# Patient Record
Sex: Female | Born: 1980 | Race: White | Hispanic: No | Marital: Single | Smoking: Never smoker
Health system: Southern US, Community
[De-identification: ages and names within clinical notes are randomized; demographics above are authoritative.]

## PROBLEM LIST (undated history)

## (undated) HISTORY — PX: AUGMENTATION MAMMAPLASTY: SUR837

## (undated) HISTORY — PX: NO PAST SURGERIES: SHX2092

---

## 2018-04-11 ENCOUNTER — Ambulatory Visit
Admission: EM | Admit: 2018-04-11 | Discharge: 2018-04-11 | Disposition: A | Discharge status: Home / Self Care | Payer: BLUE CROSS/BLUE SHIELD | Attending: Family Medicine | Admitting: Family Medicine

## 2018-04-11 ENCOUNTER — Encounter: Payer: Self-pay | Admitting: Emergency Medicine

## 2018-04-11 ENCOUNTER — Ambulatory Visit (INDEPENDENT_AMBULATORY_CARE_PROVIDER_SITE_OTHER): Payer: BLUE CROSS/BLUE SHIELD

## 2018-04-11 ENCOUNTER — Other Ambulatory Visit: Payer: Self-pay

## 2018-04-11 DIAGNOSIS — S0992XA Unspecified injury of nose, initial encounter: Secondary | ICD-10-CM

## 2018-04-11 DIAGNOSIS — W1830XA Fall on same level, unspecified, initial encounter: Secondary | ICD-10-CM | POA: Diagnosis not present

## 2018-04-11 MED ORDER — MELOXICAM 15 MG PO TABS: 15.0000 mg | ORAL_TABLET | Freq: Every day | ORAL | 0 refills | Status: DC | PRN

## 2018-04-11 NOTE — ED Provider Notes (Signed)
[facility]    CSN: [id number] Arrival date & time: 04/11/18  1751  History   Chief Complaint Chief Complaint  Patient presents with  . Fall   HPI  37 year old female presents with the above complaint.  Patient [place] 2 [place].  Patient states that she tripped on the sidewalk and fell.  When she fell she protected her mouth but injured her nose.  Patient also suffered some abrasions around her left eye.  She states that she has been doing okay but her nose is persistently swollen and appears abnormal.  She has a bump on the bridge of her nose.  States that swelling in the look of her nose has not improved despite this occurring 2 weeks ago.  She is concerned about nasal fracture.  She is really concerned about her appearance.  She is breathing without difficulty.  She reports mild pain.  No other reported symptoms.  No other complaints.  PMH, Surgical Hx, Family Hx, Social History reviewed and updated as below.  No significant PMH.  Past Surgical History:  Procedure Laterality Date  . NO PAST SURGERIES     OB History   No obstetric history on file.    Home Medications    Prior to Admission medications   Medication Sig Start Date End Date Taking? Authorizing Provider  desogestrel-ethinyl estradiol (MIRCETTE) 0.15-0.02/0.01 MG (21/5) tablet Take 1 tablet by mouth daily.   Yes [provider]  meloxicam (MOBIC) 15 MG tablet Take 1 tablet (15 mg total) by mouth daily as needed. 04/11/18   Tommie Samsook, Dorothy Polhemus G, DO   Family History Family History  Problem Relation Age of Onset  . Healthy Mother   . Cancer Father     Social History Social History   Tobacco Use  . Smoking status: Never Smoker  . Smokeless tobacco: Never Used  Substance Use Topics  . Alcohol use: Never    Frequency: Never  . Drug use: Never     Allergies   Patient has no known allergies.   Review of Systems Review of Systems    Constitutional: Negative.   HENT:       Facial injury, nasal injury.   Physical Exam Triage Vital Signs ED Triage Vitals [04/11/18 1805]  Enc Vitals Group     BP      Pulse      Resp      Temp      Temp src      SpO2      Weight 126 lb (57.2 kg)     Height 5\' 9"  (1.753 m)     Head Circumference      Peak Flow      Pain Score 2     Pain Loc      Pain Edu?      Excl. in GC?    Updated Vital Signs Ht 5\' 9"  (1.753 m)   Wt 57.2 kg   LMP 03/21/2018 (Approximate)   BMI 18.61 kg/m   Visual Acuity Right Eye Distance:   Left Eye Distance:   Bilateral Distance:    Right Eye Near:   Left Eye Near:    Bilateral Near:     Physical Exam Vitals signs and nursing note reviewed.  Constitutional:      General: She is not in acute distress. HENT:     Nose:     Comments: Mild swelling noted of the nasal bridge.  There is a  visible raised area at the nasal bridge.  No palpable crepitus.  Mildly tender to palpation. Eyes:     General: No scleral icterus.    Conjunctiva/sclera: Conjunctivae normal.     Pupils: Pupils are equal, round, and reactive to light.  Cardiovascular:     Rate and Rhythm: Normal rate and regular rhythm.  Pulmonary:     Effort: Pulmonary effort is normal. No respiratory distress.  Neurological:     Mental Status: She is alert.  Psychiatric:        Mood and Affect: Mood normal.        Behavior: Behavior normal.    UC Treatments / Results  Labs (all labs ordered are listed, but only abnormal results are displayed) Labs Reviewed - No data to display  EKG None  Radiology Dg Nasal Bones  Result Date: 04/11/2018 CLINICAL DATA:  Fall, nose injury 2 weeks ago EXAM: NASAL BONES - 3+ VIEW COMPARISON:  None. FINDINGS: There is no evidence of fracture or other bone abnormality. IMPRESSION: Negative. Electronically Signed   By: Marlan Palauharles  Clark M.D.   On: 04/11/2018 18:45    Procedures Procedures (including critical care time)  Medications Ordered in  UC Medications - No data to display  Initial Impression / Assessment and Plan / UC Course  I have reviewed the triage vital signs and the nursing notes.  Pertinent labs & imaging results that were available during my care of the patient were reviewed by me and considered in my medical decision making (see chart for details).    37 year old female presents with a fall/nasal injury.  X-ray was obtained and was negative.  Advised her to see a plastic surgeon if she continues to be concerned about the cosmetic appearance of her nose. Mobic as needed.  Final Clinical Impressions(s) / UC Diagnoses   Final diagnoses:  Injury of nose, initial encounter     Discharge Instructions     Medication as needed.  Contact plastic surgery - [facility].  Take care  Dr. Adriana Simasook     ED Prescriptions    Medication Sig Dispense Auth. Provider   meloxicam (MOBIC) 15 MG tablet Take 1 tablet (15 mg total) by mouth daily as needed. 30 tablet Tommie Samsook, Chevie Birkhead G, DO     Controlled Substance Prescriptions [place] Controlled Substance Registry consulted? Not Applicable   Tommie SamsCook, Fabrizio Filip G, DO 04/11/18 1901

## 2018-04-11 NOTE — Discharge Instructions (Signed)
Medication as needed.  Contact plastic surgery - [facility].  Take care  Dr. Adriana Simasook

## 2018-04-11 NOTE — ED Triage Notes (Signed)
Pt fell at the parade 2 weeks ago and is still having pain, bruising, and swelling of her nose, and orbital bones. She is concerned that her nose is broken.

## 2019-04-05 ENCOUNTER — Other Ambulatory Visit: Payer: Self-pay | Admitting: General Practice

## 2019-04-05 DIAGNOSIS — Z20822 Contact with and (suspected) exposure to covid-19: Secondary | ICD-10-CM

## 2019-04-06 ENCOUNTER — Telehealth: Payer: Self-pay | Admitting: Critical Care Medicine

## 2019-04-06 LAB — NOVEL CORONAVIRUS, NAA: SARS-CoV-2, NAA: DETECTED — AB

## 2019-04-06 NOTE — Telephone Encounter (Signed)
I connected this patient who is Covid positive from December 12.  She has been symptomatic since December 8th.  She knows to be in isolation at least until at least December 19.  She is not a candidate for monoclonal antibody.  Her symptoms are mild.

## 2019-04-06 NOTE — Telephone Encounter (Signed)
-----   Message from Sarah E Ellington, RN sent at 04/06/2019  4:17 PM EST -----  ----- Message ----- From: Interface, [facility] Lab Results In Sent: 04/06/2019   2:36 PM EST To: Mobile Screening Testing Result Pool   

## 2021-04-21 ENCOUNTER — Other Ambulatory Visit: Payer: Self-pay | Admitting: Otolaryngology

## 2021-04-21 DIAGNOSIS — S0292XS Unspecified fracture of facial bones, sequela: Secondary | ICD-10-CM

## 2021-04-21 DIAGNOSIS — M950 Acquired deformity of nose: Secondary | ICD-10-CM

## 2021-05-03 ENCOUNTER — Ambulatory Visit
Admission: RE | Admit: 2021-05-03 | Discharge: 2021-05-03 | Disposition: A | Discharge status: Home / Self Care | Payer: BC Managed Care – PPO | Source: Ambulatory Visit | Attending: Otolaryngology | Admitting: Otolaryngology

## 2021-05-03 ENCOUNTER — Other Ambulatory Visit: Payer: Self-pay

## 2021-05-03 DIAGNOSIS — X58XXXS Exposure to other specified factors, sequela: Secondary | ICD-10-CM | POA: Diagnosis not present

## 2021-05-03 DIAGNOSIS — J3489 Other specified disorders of nose and nasal sinuses: Secondary | ICD-10-CM | POA: Insufficient documentation

## 2021-05-03 DIAGNOSIS — S0292XS Unspecified fracture of facial bones, sequela: Secondary | ICD-10-CM | POA: Insufficient documentation

## 2021-05-03 DIAGNOSIS — R0600 Dyspnea, unspecified: Secondary | ICD-10-CM | POA: Diagnosis not present

## 2021-05-03 DIAGNOSIS — R0683 Snoring: Secondary | ICD-10-CM | POA: Insufficient documentation

## 2021-05-03 DIAGNOSIS — M950 Acquired deformity of nose: Secondary | ICD-10-CM | POA: Insufficient documentation

## 2021-05-03 DIAGNOSIS — S0291XS Unspecified fracture of skull, sequela: Secondary | ICD-10-CM | POA: Diagnosis present

## 2021-05-25 HISTORY — PX: NASAL SEPTUM SURGERY: SHX37

## 2021-08-26 ENCOUNTER — Encounter: Payer: BLUE CROSS/BLUE SHIELD | Admitting: Obstetrics and Gynecology

## 2021-09-08 ENCOUNTER — Ambulatory Visit (INDEPENDENT_AMBULATORY_CARE_PROVIDER_SITE_OTHER): Payer: BC Managed Care – PPO | Admitting: Obstetrics and Gynecology

## 2021-09-08 ENCOUNTER — Encounter: Payer: Self-pay | Admitting: Obstetrics and Gynecology

## 2021-09-08 ENCOUNTER — Other Ambulatory Visit (HOSPITAL_COMMUNITY)
Admission: RE | Admit: 2021-09-08 | Discharge: 2021-09-08 | Disposition: A | Discharge status: Home / Self Care | Payer: BC Managed Care – PPO | Source: Ambulatory Visit | Attending: Obstetrics and Gynecology | Admitting: Obstetrics and Gynecology

## 2021-09-08 VITALS — BP 130/71 | HR 94 | Ht 69.0 in | Wt 124.5 lb

## 2021-09-08 DIAGNOSIS — Z01419 Encounter for gynecological examination (general) (routine) without abnormal findings: Secondary | ICD-10-CM | POA: Insufficient documentation

## 2021-09-08 DIAGNOSIS — Z124 Encounter for screening for malignant neoplasm of cervix: Secondary | ICD-10-CM | POA: Diagnosis not present

## 2021-09-08 DIAGNOSIS — Z7689 Persons encountering health services in other specified circumstances: Secondary | ICD-10-CM

## 2021-09-08 MED ORDER — DESOGESTREL-ETHINYL ESTRADIOL 0.15-0.02/0.01 MG (21/5) PO TABS: 1.0000 | ORAL_TABLET | Freq: Every day | ORAL | 3 refills | Status: DC

## 2021-09-08 NOTE — Progress Notes (Signed)
HPI:      [name] is a 41 y.o. G0P0000 who LMP was Patient's last menstrual period was 08/30/2021 (within days).  Subjective:   She presents today for her annual examination.  She is having normal regular cycles on OCPs.  She likes Mircette and would like to stay on pills. She reports that she had an abnormal Pap smear 6 years ago and underwent LEEP.  She says her Paps have been normal since then but she is now due for her Pap.    Hx: The following portions of the patient's history were reviewed and updated as appropriate:             She  has no past medical history on file. She does not have a problem list on file. She  has a past surgical history that includes No past surgeries and Nasal septum surgery (05/25/2021). Her family history includes Cancer in her father; Healthy in her mother. She  reports that she has never smoked. She has never used smokeless tobacco. She reports that she does not drink alcohol and does not use drugs. She has a current medication list which includes the following prescription(s): desogestrel-ethinyl estradiol. She has no allergies on file.       Review of Systems:  Review of Systems  Constitutional: Denied constitutional symptoms, night sweats, recent illness, fatigue, fever, insomnia and weight loss.  Eyes: Denied eye symptoms, eye pain, photophobia, vision change and visual disturbance.  Ears/Nose/Throat/Neck: Denied ear, nose, throat or neck symptoms, hearing loss, nasal discharge, sinus congestion and sore throat.  Cardiovascular: Denied cardiovascular symptoms, arrhythmia, chest pain/pressure, edema, exercise intolerance, orthopnea and palpitations.  Respiratory: Denied pulmonary symptoms, asthma, pleuritic pain, productive sputum, cough, dyspnea and wheezing.  Gastrointestinal: Denied, gastro-esophageal reflux, melena, nausea and vomiting.  Genitourinary: Denied genitourinary symptoms including symptomatic vaginal discharge, pelvic  relaxation issues, and urinary complaints.  Musculoskeletal: Denied musculoskeletal symptoms, stiffness, swelling, muscle weakness and myalgia.  Dermatologic: Denied dermatology symptoms, rash and scar.  Neurologic: Denied neurology symptoms, dizziness, headache, neck pain and syncope.  Psychiatric: Denied psychiatric symptoms, anxiety and depression.  Endocrine: Denied endocrine symptoms including hot flashes and night sweats.   Meds:   No current outpatient medications on file prior to visit.   No current facility-administered medications on file prior to visit.     Objective:     Vitals:   09/08/21 1332  BP: 130/71  Pulse: 94    Filed Weights   09/08/21 1332  Weight: 124 lb 8 oz (56.5 kg)              Physical examination General NAD, Conversant  HEENT Atraumatic; Op clear with mmm.  Normo-cephalic. Pupils reactive. Anicteric sclerae  Thyroid/Neck Smooth without nodularity or enlargement. Normal ROM.  Neck Supple.  Skin No rashes, lesions or ulceration. Normal palpated skin turgor. No nodularity.  Breasts: No masses or discharge.  Symmetric.  No axillary adenopathy.  Bilateral implants  Lungs: Clear to auscultation.No rales or wheezes. Normal Respiratory effort, no retractions.  Heart: NSR.  No murmurs or rubs appreciated. No periferal edema  Abdomen: Soft.  Non-tender.  No masses.  No HSM. No hernia  Extremities: Moves all appropriately.  Normal ROM for age. No lymphadenopathy.  Neuro: Oriented to PPT.  Normal mood. Normal affect.     Pelvic:   Vulva: Normal appearance.  No lesions.  Vagina: No lesions or abnormalities noted.  Support: Normal pelvic support.  Urethra No masses tenderness or scarring.  Meatus  Normal size without lesions or prolapse.  Cervix: Normal appearance.  No lesions.  Friable  Anus: Normal exam.  No lesions.  Perineum: Normal exam.  No lesions.        Bimanual   Uterus: Normal size.  Non-tender.  Mobile.  AV.  Adnexae: No masses.   Non-tender to palpation.  Cul-de-sac: Negative for abnormality.     Assessment:    G0P0000 There are no problems to display for this patient.    1. Well woman exam with routine gynecological exam   2. Cervical cancer screening   3. Establishing care with new doctor, encounter for        Plan:            1.  Basic Screening Recommendations The basic screening recommendations for asymptomatic women were discussed with the patient during her visit.  The age-appropriate recommendations were discussed with her and the rational for the tests reviewed.  When I am informed by the patient that another primary care physician has previously obtained the age-appropriate tests and they are up-to-date, only outstanding tests are ordered and referrals given as necessary.  Abnormal results of tests will be discussed with her when all of her results are completed.  Routine preventative health maintenance measures emphasized: Exercise/Diet/Weight control, Tobacco Warnings, Alcohol/Substance use risks and Stress Management Pap performed -mammogram next year -patient gets blood work through her PCP. Orders No orders of the defined types were placed in this encounter.    Meds ordered this encounter  Medications   desogestrel-ethinyl estradiol (MIRCETTE) 0.15-0.02/0.01 MG (21/5) tablet    Sig: Take 1 tablet by mouth daily.    Dispense:  84 tablet    Refill:  3          F/U  No follow-ups on file.  Elonda Husky, M.D. 09/08/2021 2:41 PM

## 2021-09-08 NOTE — Progress Notes (Signed)
Patients presents for annual exam today. Patient states no other questions or concerns at this time. Patient is due for pap smear, ordered.

## 2021-09-12 LAB — CYTOLOGY - PAP
Comment: NEGATIVE
Diagnosis: NEGATIVE
High risk HPV: NEGATIVE

## 2021-09-13 ENCOUNTER — Telehealth: Payer: Self-pay | Admitting: Obstetrics and Gynecology

## 2021-09-13 NOTE — Telephone Encounter (Signed)
Spoke with patient, advised that pap was normal and negative. Patient voiced understanding.

## 2021-09-13 NOTE — Telephone Encounter (Signed)
Pt called asking for pap results- I assisted on trying to get mychart account set up- pt is till working on it. Please advise on results.

## 2021-10-04 ENCOUNTER — Encounter: Payer: BLUE CROSS/BLUE SHIELD | Admitting: Obstetrics and Gynecology

## 2022-09-26 ENCOUNTER — Encounter: Payer: Self-pay | Admitting: Obstetrics and Gynecology

## 2022-09-26 ENCOUNTER — Other Ambulatory Visit (HOSPITAL_COMMUNITY)
Admission: RE | Admit: 2022-09-26 | Discharge: 2022-09-26 | Disposition: A | Discharge status: Home / Self Care | Payer: BC Managed Care – PPO | Source: Ambulatory Visit | Attending: Obstetrics and Gynecology | Admitting: Obstetrics and Gynecology

## 2022-09-26 ENCOUNTER — Ambulatory Visit (INDEPENDENT_AMBULATORY_CARE_PROVIDER_SITE_OTHER): Payer: BC Managed Care – PPO | Admitting: Obstetrics and Gynecology

## 2022-09-26 VITALS — BP 110/78 | HR 93 | Ht 69.0 in | Wt 136.6 lb

## 2022-09-26 DIAGNOSIS — Z124 Encounter for screening for malignant neoplasm of cervix: Secondary | ICD-10-CM | POA: Insufficient documentation

## 2022-09-26 DIAGNOSIS — Z01419 Encounter for gynecological examination (general) (routine) without abnormal findings: Secondary | ICD-10-CM

## 2022-09-26 DIAGNOSIS — Z1231 Encounter for screening mammogram for malignant neoplasm of breast: Secondary | ICD-10-CM

## 2022-09-26 MED ORDER — DESOGESTREL-ETHINYL ESTRADIOL 0.15-0.02/0.01 MG (21/5) PO TABS: 1.0000 | ORAL_TABLET | Freq: Every day | ORAL | 3 refills | Status: DC

## 2022-09-26 NOTE — Progress Notes (Signed)
HPI:      [name] is a 42 y.o. G0P0000 who LMP was No LMP recorded.  Subjective:   She presents today for her annual examination.  She is having normal regular cycles on OCPs.  She would like to continue them.  She has a remote history of LEEP approximately 7 years ago.  She had a negative Pap smear last year but would feel more comfortable having a Pap again this year before beginning to space them out.  She has never had a mammogram but is due for one.  She gets her routine lab work through her primary care physician.    Hx: The following portions of the patient's history were reviewed and updated as appropriate:             She  has no past medical history on file. She does not have a problem list on file. She  has a past surgical history that includes No past surgeries and Nasal septum surgery (05/25/2021). Her family history includes Cancer in her father; Healthy in her mother. She  reports that she has never smoked. She has never used smokeless tobacco. She reports that she does not drink alcohol and does not use drugs. She has a current medication list which includes the following prescription(s): desogestrel-ethinyl estradiol. She has no allergies on file.       Review of Systems:  Review of Systems  Constitutional: Denied constitutional symptoms, night sweats, recent illness, fatigue, fever, insomnia and weight loss.  Eyes: Denied eye symptoms, eye pain, photophobia, vision change and visual disturbance.  Ears/Nose/Throat/Neck: Denied ear, nose, throat or neck symptoms, hearing loss, nasal discharge, sinus congestion and sore throat.  Cardiovascular: Denied cardiovascular symptoms, arrhythmia, chest pain/pressure, edema, exercise intolerance, orthopnea and palpitations.  Respiratory: Denied pulmonary symptoms, asthma, pleuritic pain, productive sputum, cough, dyspnea and wheezing.  Gastrointestinal: Denied, gastro-esophageal reflux, melena, nausea and vomiting.   Genitourinary: Denied genitourinary symptoms including symptomatic vaginal discharge, pelvic relaxation issues, and urinary complaints.  Musculoskeletal: Denied musculoskeletal symptoms, stiffness, swelling, muscle weakness and myalgia.  Dermatologic: Denied dermatology symptoms, rash and scar.  Neurologic: Denied neurology symptoms, dizziness, headache, neck pain and syncope.  Psychiatric: Denied psychiatric symptoms, anxiety and depression.  Endocrine: Denied endocrine symptoms including hot flashes and night sweats.   Meds:   No current outpatient medications on file prior to visit.   No current facility-administered medications on file prior to visit.     Objective:     Vitals:   09/26/22 1350  BP: 110/78  Pulse: 93    Filed Weights   09/26/22 1350  Weight: 136 lb 9.6 oz (62 kg)              Physical examination General NAD, Conversant  HEENT Atraumatic; Op clear with mmm.  Normo-cephalic. Pupils reactive. Anicteric sclerae  Thyroid/Neck Smooth without nodularity or enlargement. Normal ROM.  Neck Supple.  Skin No rashes, lesions or ulceration. Normal palpated skin turgor. No nodularity.  Breasts: No masses or discharge.  Symmetric.  No axillary adenopathy.  Bilateral implants  Lungs: Clear to auscultation.No rales or wheezes. Normal Respiratory effort, no retractions.  Heart: NSR.  No murmurs or rubs appreciated. No peripheral edema  Abdomen: Soft.  Non-tender.  No masses.  No HSM. No hernia  Extremities: Moves all appropriately.  Normal ROM for age. No lymphadenopathy.  Neuro: Oriented to PPT.  Normal mood. Normal affect.     Pelvic:   Vulva: Normal appearance.  No lesions.  Vagina: No lesions or abnormalities noted.  Support: Normal pelvic support.  Urethra No masses tenderness or scarring.  Meatus Normal size without lesions or prolapse.  Cervix: Normal appearance.  No lesions.  Anus: Normal exam.  No lesions.  Perineum: Normal exam.  No lesions.         Bimanual   Uterus: Normal size.  Non-tender.  Mobile.  AV.  Adnexae: No masses.  Non-tender to palpation.  Cul-de-sac: Negative for abnormality.     Assessment:    G0P0000 There are no problems to display for this patient.    1. Well woman exam with routine gynecological exam   2. Screening mammogram for breast cancer   3. Cervical cancer screening        Plan:            1.  Basic Screening Recommendations The basic screening recommendations for asymptomatic women were discussed with the patient during her visit.  The age-appropriate recommendations were discussed with her and the rational for the tests reviewed.  When I am informed by the patient that another primary care physician has previously obtained the age-appropriate tests and they are up-to-date, only outstanding tests are ordered and referrals given as necessary.  Abnormal results of tests will be discussed with her when all of her results are completed.  Routine preventative health maintenance measures emphasized: Exercise/Diet/Weight control, Tobacco Warnings, Alcohol/Substance use risks and Stress Management Pap performed this year but patient plans to skip next year if this 1 is negative again. (Remote history of LEEP) -mammogram ordered 2.  Continue OCPs Orders Orders Placed This Encounter  Procedures   MM DIGITAL SCREENING BILATERAL     Meds ordered this encounter  Medications   desogestrel-ethinyl estradiol (MIRCETTE) 0.15-0.02/0.01 MG (21/5) tablet    Sig: Take 1 tablet by mouth daily.    Dispense:  84 tablet    Refill:  3          F/U  No follow-ups on file.  Elonda Husky, M.D. 09/26/2022 2:21 PM

## 2022-09-26 NOTE — Progress Notes (Signed)
Patients presents for annual exam today. She states liking her current OCP, would like to continue. Due for mammogram, ordered. She states no other questions or concerns at this time.

## 2022-09-29 LAB — CYTOLOGY - PAP
Comment: NEGATIVE
Diagnosis: NEGATIVE
High risk HPV: NEGATIVE

## 2022-12-20 NOTE — Addendum Note (Signed)
Addended by: Loman Chroman on: 12/20/2022 02:06 PM   Modules accepted: Orders

## 2023-01-16 ENCOUNTER — Ambulatory Visit
Admission: RE | Admit: 2023-01-16 | Discharge: 2023-01-16 | Disposition: A | Discharge status: Home / Self Care | Payer: BC Managed Care – PPO | Source: Ambulatory Visit | Attending: Obstetrics and Gynecology | Admitting: Obstetrics and Gynecology

## 2023-01-16 DIAGNOSIS — Z01419 Encounter for gynecological examination (general) (routine) without abnormal findings: Secondary | ICD-10-CM | POA: Insufficient documentation

## 2023-01-16 DIAGNOSIS — Z1231 Encounter for screening mammogram for malignant neoplasm of breast: Secondary | ICD-10-CM | POA: Diagnosis present

## 2023-01-17 ENCOUNTER — Other Ambulatory Visit: Payer: Self-pay | Admitting: Obstetrics and Gynecology

## 2023-01-17 DIAGNOSIS — R928 Other abnormal and inconclusive findings on diagnostic imaging of breast: Secondary | ICD-10-CM

## 2023-01-22 ENCOUNTER — Telehealth: Payer: Self-pay | Admitting: Obstetrics and Gynecology

## 2023-01-22 NOTE — Telephone Encounter (Signed)
Patient stated that a fax was sent for Dr. Logan Bores to sign for additional imaging.  Will you please check to see if he got this and get him to sign.  [place] and they are being sent there through her job).  She would like to get his taken care of as soon as possible.

## 2023-01-23 NOTE — Telephone Encounter (Signed)
Dr. Logan Bores has signed the order. Patient has been made aware.

## 2023-01-26 ENCOUNTER — Ambulatory Visit
Admission: RE | Admit: 2023-01-26 | Discharge: 2023-01-26 | Disposition: A | Discharge status: Home / Self Care | Payer: BC Managed Care – PPO | Source: Ambulatory Visit | Attending: Obstetrics and Gynecology | Admitting: Obstetrics and Gynecology

## 2023-01-26 DIAGNOSIS — R928 Other abnormal and inconclusive findings on diagnostic imaging of breast: Secondary | ICD-10-CM

## 2023-03-01 ENCOUNTER — Telehealth: Payer: Self-pay

## 2023-03-01 NOTE — Telephone Encounter (Signed)
She called and would like to switch her Mircette to Norethindrone (Progesterone only pill) She wants to stop having cycles. She has tried to taking Mircette continuosly and her cycle came on 1 week after skipping the sugar pills. She has been taking Mircette since she was 36, it works well for her but she no longer wants to have periods.   Please advise.

## 2023-04-12 ENCOUNTER — Telehealth: Payer: Self-pay

## 2023-04-12 NOTE — Telephone Encounter (Signed)
 TRIAGE VOICEMAIL: Patient requesting return call. No details given.

## 2023-04-12 NOTE — Telephone Encounter (Signed)
Chart reviewed. Patient is scheduled for 04/13/23.

## 2023-04-12 NOTE — Telephone Encounter (Signed)
Left voicemail to return call. Advised if she receives voicemail to please leave details of how we may assist her.

## 2023-04-13 ENCOUNTER — Ambulatory Visit: Payer: BC Managed Care – PPO | Admitting: Obstetrics

## 2023-04-13 ENCOUNTER — Other Ambulatory Visit (HOSPITAL_COMMUNITY)
Admission: RE | Admit: 2023-04-13 | Discharge: 2023-04-13 | Disposition: A | Discharge status: Home / Self Care | Payer: BC Managed Care – PPO | Source: Ambulatory Visit | Attending: Obstetrics | Admitting: Obstetrics

## 2023-04-13 ENCOUNTER — Ambulatory Visit: Payer: BC Managed Care – PPO

## 2023-04-13 ENCOUNTER — Encounter: Payer: Self-pay | Admitting: Obstetrics

## 2023-04-13 VITALS — BP 116/74 | HR 83 | Wt 127.0 lb

## 2023-04-13 DIAGNOSIS — N898 Other specified noninflammatory disorders of vagina: Secondary | ICD-10-CM

## 2023-04-13 NOTE — Progress Notes (Signed)
   GYN ENCOUNTER  Subjective  HPI: Kristine Herring is a 42 y.o. G0P0000 who presents today for vaginal odor. She reports that for the past month, she has noticed a vaginal odor. She denies abnormal discharge or itching. She did change her body wash but has had no other changes. She is sexually active with one female partner.  History reviewed. No pertinent past medical history. Past Surgical History:  Procedure Laterality Date   AUGMENTATION MAMMAPLASTY Bilateral    NASAL SEPTUM SURGERY  05/25/2021   NO PAST SURGERIES     OB History     Gravida  0   Para  0   Term  0   Preterm  0   AB  0   Living  0      SAB  0   IAB  0   Ectopic  0   Multiple  0   Live Births  0          Not on File  ROS: See HPI  Objective  BP 116/74   Pulse 83   Wt 127 lb (57.6 kg)   BMI 18.75 kg/m   Physical examination   Pelvic:   Vulva: Normal appearance.  No lesions.  Vagina: No lesions or abnormalities noted.  Urethra No masses tenderness or scarring.  Meatus Normal size without lesions or prolapse.  Cervix: Normal appearance.  No lesions. Small amount of dark red blood from os.  Perineum: Normal exam.  No lesions.    Assessment  Vaginal odor  Plan -Swabs collected. F/u based on results. May use vaginal boric acid if desired.  Guadlupe Spanish, CNM

## 2023-04-16 ENCOUNTER — Ambulatory Visit: Payer: BC Managed Care – PPO | Admitting: Obstetrics

## 2023-04-17 LAB — CERVICOVAGINAL ANCILLARY ONLY
Bacterial Vaginitis (gardnerella): POSITIVE — AB
Candida Glabrata: NEGATIVE
Candida Vaginitis: NEGATIVE
Chlamydia: NEGATIVE
Comment: NEGATIVE
Comment: NEGATIVE
Comment: NEGATIVE
Comment: NEGATIVE
Comment: NEGATIVE
Comment: NORMAL
Neisseria Gonorrhea: NEGATIVE
Trichomonas: NEGATIVE

## 2023-04-20 ENCOUNTER — Encounter: Payer: Self-pay | Admitting: Obstetrics

## 2023-04-20 ENCOUNTER — Other Ambulatory Visit: Payer: Self-pay | Admitting: Obstetrics

## 2023-04-20 MED ORDER — METRONIDAZOLE 500 MG PO TABS: 500.0000 mg | ORAL_TABLET | Freq: Two times a day (BID) | ORAL | 0 refills | Status: AC

## 2023-04-20 NOTE — Progress Notes (Signed)
+  BV. Rx for metronidazole 500 mg PO BID x 7 days sent to pharmacy. Manaal notified via MyChart.  M. Chryl Heck, CNM

## 2023-04-30 IMAGING — CT CT MAXILLOFACIAL W/O CM
1 series · 15 of 30 positions shown, 19 images · non-contrast
Comparison: Radiographs of the nasal bones 04/11/2018.

CLINICAL DATA: Provided history: Late effect of fracture of skull
and face bones. Acquired deformity of nose. Additional history
hitting nose and forehead on curb. Difficulty breathing with
congestion and snoring since accident.

EXAM:
CT MAXILLOFACIAL WITHOUT CONTRAST
TECHNIQUE: Multidetector CT imaging of the maxillofacial structures was
performed. Multiplanar CT image reconstructions were also generated.

[Series 2: max soft · axial · 0.33mm/px · z∈[-144,+36]mm · 15 of 98 slices shown, 19 images]
[im 4/98  brain]
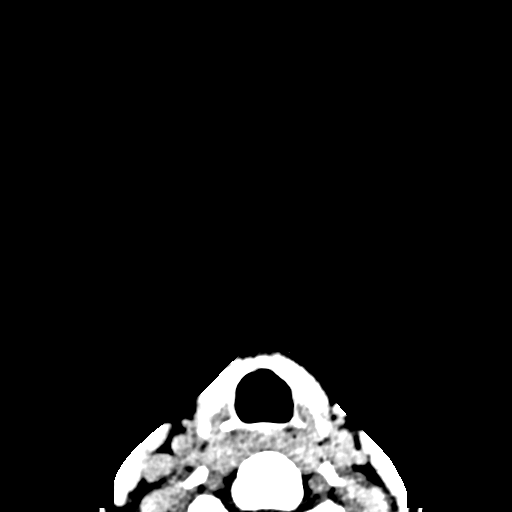
[im 4/98  bone]
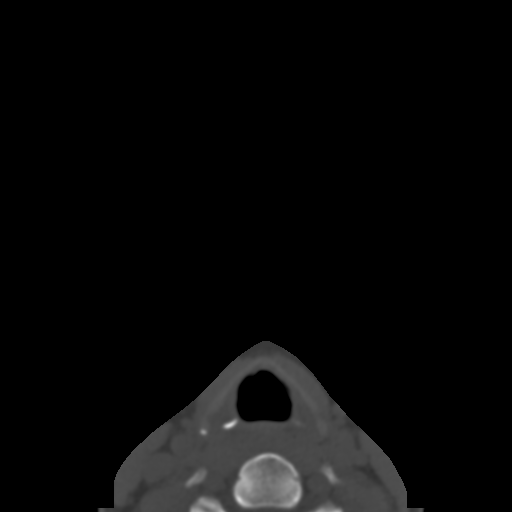
[im 11/98  bone]
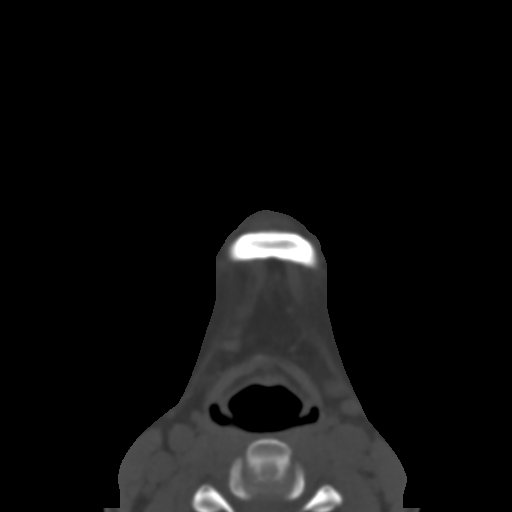
[im 17/98  bone]
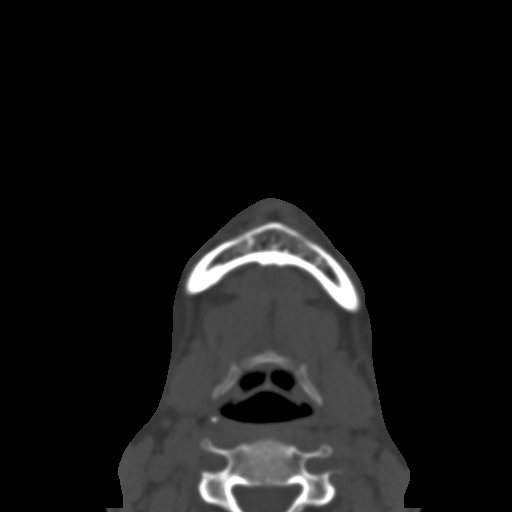
[im 24/98  bone]
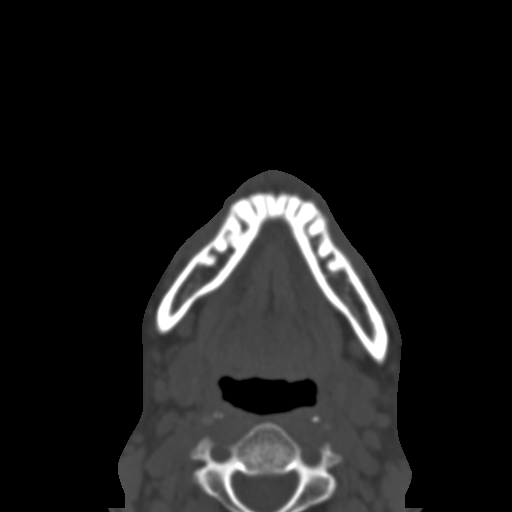
[im 31/98  brain]
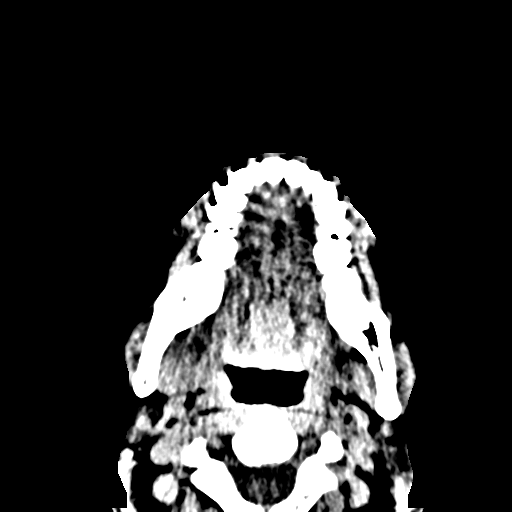
[im 31/98  bone]
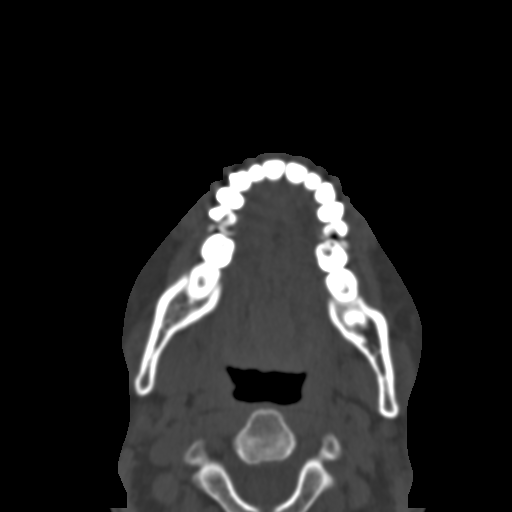
[im 37/98  bone]
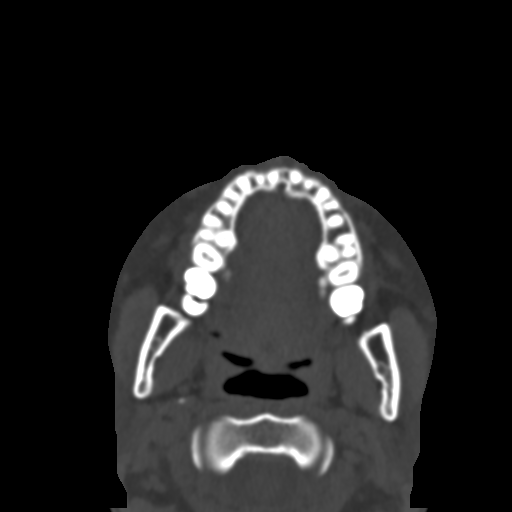
[im 44/98  bone]
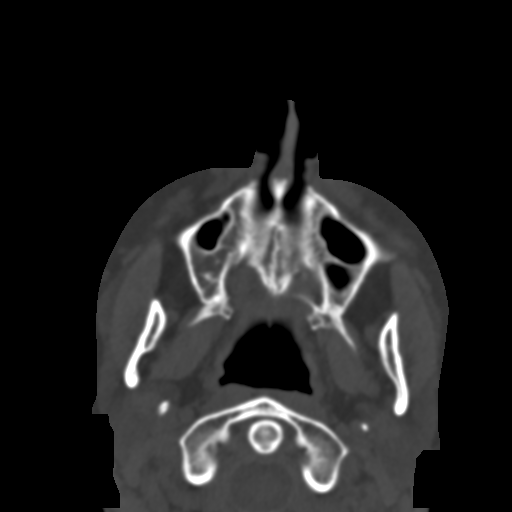
[im 51/98  bone]
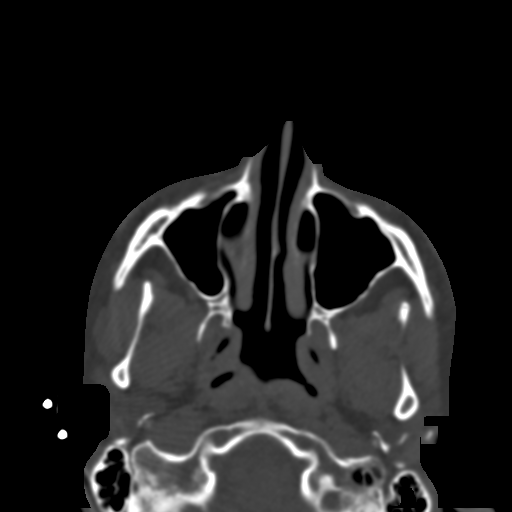
[im 54/98  brain]
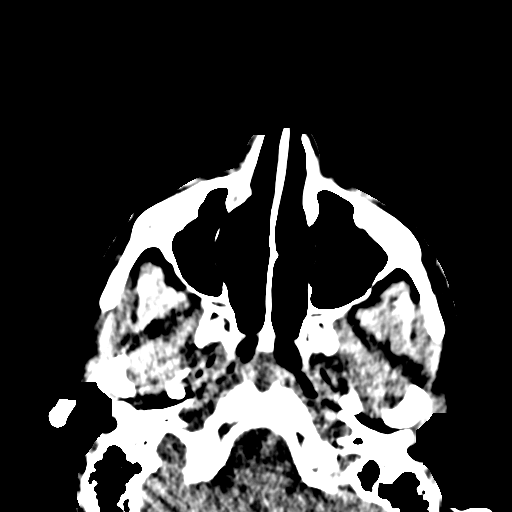
[im 54/98  bone]
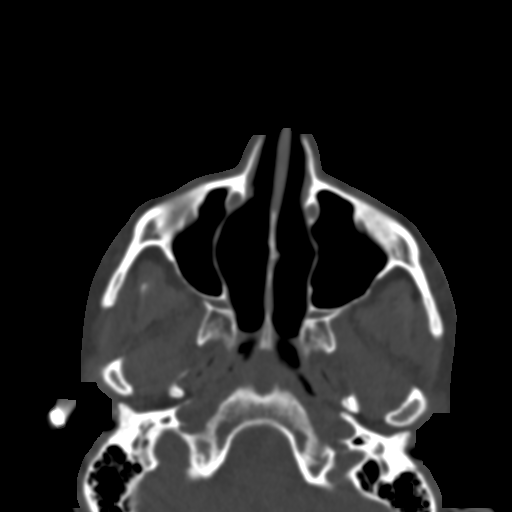
[im 61/98  bone]
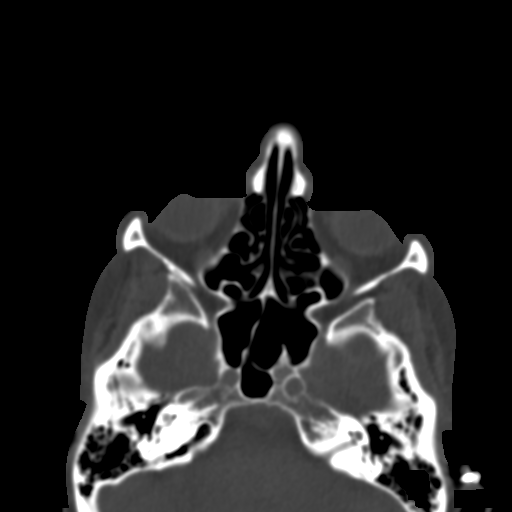
[im 67/98  bone]
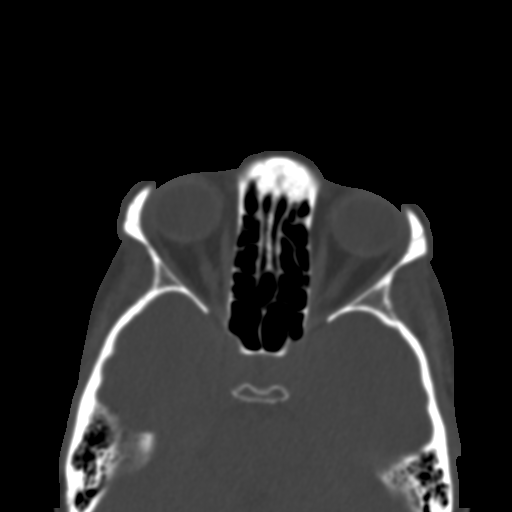
[im 74/98  bone]
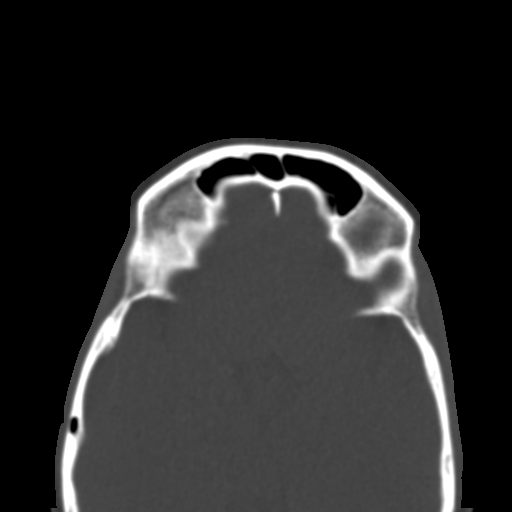
[im 81/98  brain]
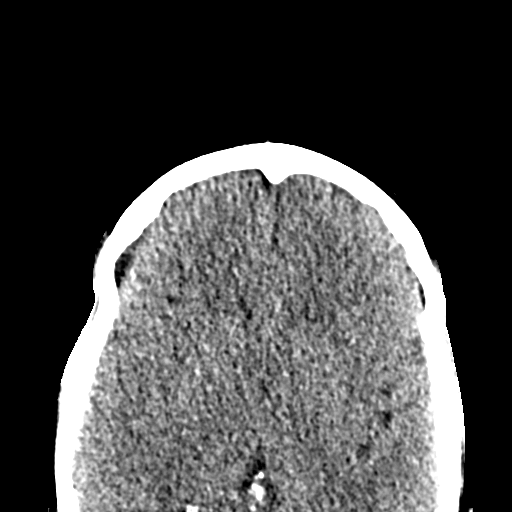
[im 81/98  bone]
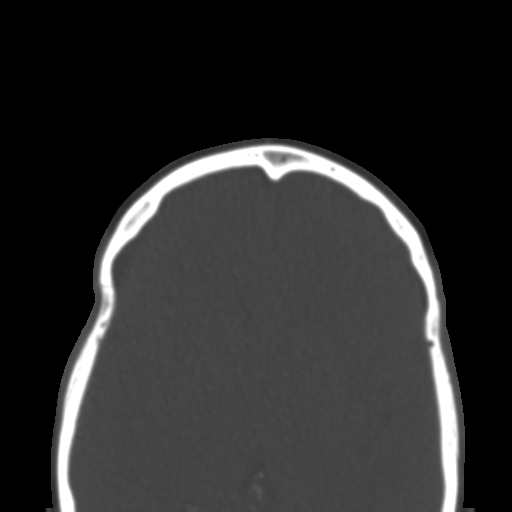
[im 87/98  bone]
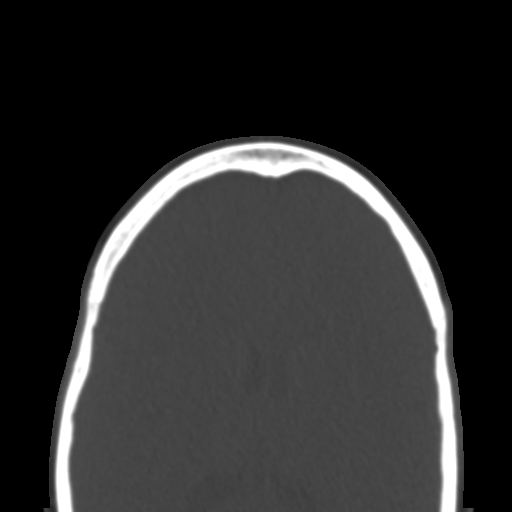
[im 94/98  bone]
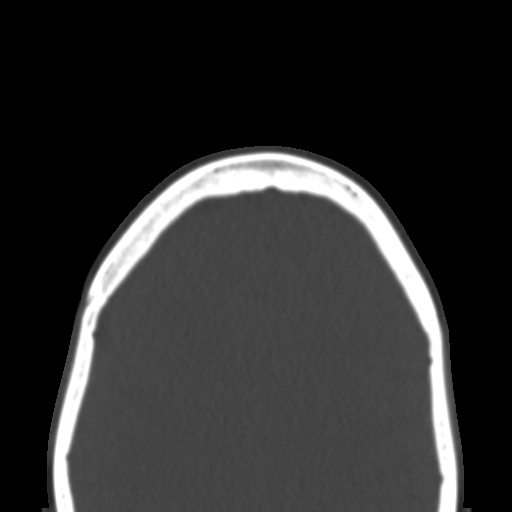

[15 of 30 positions shown; findings below may reference images not displayed]

FINDINGS: Osseous: No maxillofacial fracture is identified.

Orbits: No orbital mass or acute orbital finding.

Sinuses: No significant paranasal sinus disease.

Soft tissues: Patchy induration within the bilateral periorbital and
facial soft tissues, likely reflecting sequela of prior cosmetic
injections.

Limited intracranial: No evidence of acute intracranial abnormality
within the field of view.

Other: The nasal septum is essentially midline. Mild leftward bony
spurring of the nasal septum posteriorly (series 6, image 35).
Bilateral middle turbinate concha bullosa.
IMPRESSION: No maxillofacial fracture is identified.

No significant paranasal sinus disease.

The nasal septum is essentially midline. However, there is mild
leftward bony spurring of the nasal septum posteriorly.

Bilateral middle turbinate concha bullosa.

## 2024-01-18 NOTE — Progress Notes (Signed)
 ANNUAL GYNECOLOGICAL EXAM  SUBJECTIVE  HPI  Kristine Herring is a 43 y.o.-year-old [id number] who presents for an annual gynecological exam today.  She denies pelvic pain, dyspareunia, abnormal vaginal bleeding or discharge, and UTI symptoms. She prefers yearly Pap smears d/t h/o cervical dysplasia. She is currently sexually active with one female partner. She uses COCs for contraception and is happy with this method.  Medical/Surgical History History reviewed. No pertinent past medical history. Past Surgical History:  Procedure Laterality Date   AUGMENTATION MAMMAPLASTY Bilateral    NASAL SEPTUM SURGERY  05/25/2021   NO PAST SURGERIES      Social History Lives with 2 Frenchies.  Work: works for the state Exercise: 4-5x/week Substances: Social EtOH; denies tobacco, vape, and recreational drugs  Obstetric History OB History     Gravida  0   Para  0   Term  0   Preterm  0   AB  0   Living  0      SAB  0   IAB  0   Ectopic  0   Multiple  0   Live Births  0            GYN/Menstrual History  Regular monthly peiods Last Pap:09/26/2022 pap normal  Contraception: OCPs  Prevention Mammogram: yearly Colonoscopy: at 26 Flu shot/vaccines: declines  Current Medications Outpatient Medications Prior to Visit  Medication Sig   metroNIDAZOLE  (FLAGYL ) 500 MG tablet Take 1 tablet (500 mg total) by mouth 2 (two) times daily. (Patient not taking: Reported on 01/21/2024)   [DISCONTINUED] desogestrel -ethinyl estradiol  (MIRCETTE ) 0.15-0.02/0.01 MG (21/5) tablet Take 1 tablet by mouth daily.   No facility-administered medications prior to visit.        ROS Constitutional: Denied constitutional symptoms, night sweats, recent illness, fatigue, fever, insomnia and weight loss.  Eyes: Denied eye symptoms, eye pain, photophobia, vision change and visual disturbance.  Ears/Nose/Throat/Neck: Denied ear, nose, throat or neck symptoms, hearing loss, nasal discharge, sinus  congestion and sore throat.  Cardiovascular: Denied cardiovascular symptoms, arrhythmia, chest pain/pressure, edema, exercise intolerance, orthopnea and palpitations.  Respiratory: Denied pulmonary symptoms, asthma, pleuritic pain, productive sputum, cough, dyspnea and wheezing.  Gastrointestinal: Denied gastro-esophageal reflux, melena, nausea and vomiting.  Genitourinary: Denied genitourinary symptoms including symptomatic vaginal discharge, pelvic relaxation issues, and urinary complaints.  Musculoskeletal: Denied musculoskeletal symptoms, stiffness, swelling, muscle weakness and myalgia.  Dermatologic: Denied dermatology symptoms, rash and scar.  Neurologic: Denied neurology symptoms, dizziness, headache, neck pain and syncope.  Psychiatric: Denied psychiatric symptoms, anxiety and depression.  Endocrine: Denied endocrine symptoms including hot flashes and night sweats.    OBJECTIVE  BP 97/66   Pulse (!) 101   Ht 5' 10 (1.778 m)   Wt 134 lb (60.8 kg)   BMI 19.23 kg/m    Physical examination General NAD, Conversant  HEENT Atraumatic; Op clear with mmm.  Normo-cephalic. Pupils reactive. Anicteric sclerae  Thyroid/Neck Smooth without nodularity or enlargement. Normal ROM.  Neck Supple.  Skin No rashes, lesions or ulceration. Normal palpated skin turgor. No nodularity.  Breasts: Declined  Lungs: Clear to auscultation.No rales or wheezes. Normal Respiratory effort, no retractions.  Heart: NSR.  No murmurs or rubs appreciated. No peripheral edema  Abdomen: Soft.  Non-tender.  No masses.  No HSM. No hernia  Extremities: Moves all appropriately.  Normal ROM for age. No lymphadenopathy.  Neuro: Oriented to PPT.  Normal mood. Normal affect.     Pelvic:   Vulva: Normal appearance.  No lesions.  Vagina: No lesions or abnormalities noted.  Support: Normal pelvic support.  Urethra No masses tenderness or scarring.  Meatus Normal size without lesions or prolapse.  Cervix: Normal  appearance.  No lesions.Friable. Pap collected.  Anus: Normal exam.  No lesions.  Perineum: Normal exam.  No lesions.    ASSESSMENT  1) Annual exam 2) Contraceptive management  PLAN 1) Physical exam as noted. Discussed healthy lifestyle choices and preventive care. Pap collected. STI testing done. Routine labs done with PCP. Mammogram ordered. 2) COCs refilled  Return in one year for annual exam or as needed for concerns.   Kelcy Laible, CNM

## 2024-01-21 ENCOUNTER — Encounter: Payer: Self-pay | Admitting: Obstetrics

## 2024-01-21 ENCOUNTER — Other Ambulatory Visit (HOSPITAL_COMMUNITY)
Admission: RE | Admit: 2024-01-21 | Discharge: 2024-01-21 | Disposition: A | Discharge status: Home / Self Care | Source: Ambulatory Visit | Attending: Obstetrics | Admitting: Obstetrics

## 2024-01-21 ENCOUNTER — Ambulatory Visit: Admitting: Obstetrics

## 2024-01-21 VITALS — BP 97/66 | HR 101 | Ht 70.0 in | Wt 134.0 lb

## 2024-01-21 DIAGNOSIS — Z01419 Encounter for gynecological examination (general) (routine) without abnormal findings: Secondary | ICD-10-CM | POA: Insufficient documentation

## 2024-01-21 DIAGNOSIS — Z124 Encounter for screening for malignant neoplasm of cervix: Secondary | ICD-10-CM | POA: Insufficient documentation

## 2024-01-21 DIAGNOSIS — Z0000 Encounter for general adult medical examination without abnormal findings: Secondary | ICD-10-CM

## 2024-01-21 DIAGNOSIS — Z113 Encounter for screening for infections with a predominantly sexual mode of transmission: Secondary | ICD-10-CM

## 2024-01-21 DIAGNOSIS — Z1231 Encounter for screening mammogram for malignant neoplasm of breast: Secondary | ICD-10-CM

## 2024-01-21 MED ORDER — DESOGESTREL-ETHINYL ESTRADIOL 0.15-0.02/0.01 MG (21/5) PO TABS: 1.0000 | ORAL_TABLET | Freq: Every day | ORAL | 3 refills | Status: AC

## 2024-01-22 LAB — HEP, RPR, HIV PANEL
HIV Screen 4th Generation wRfx: NONREACTIVE
Hepatitis B Surface Ag: NEGATIVE
RPR Ser Ql: NONREACTIVE

## 2024-01-23 ENCOUNTER — Ambulatory Visit: Payer: Self-pay | Admitting: Obstetrics

## 2024-01-23 LAB — CYTOLOGY - PAP
Adequacy: ABSENT
Chlamydia: NEGATIVE
Comment: NEGATIVE
Comment: NEGATIVE
Comment: NEGATIVE
Comment: NORMAL
Diagnosis: NEGATIVE
High risk HPV: NEGATIVE
Neisseria Gonorrhea: NEGATIVE
Trichomonas: NEGATIVE

## 2024-03-03 ENCOUNTER — Ambulatory Visit
Admission: RE | Admit: 2024-03-03 | Discharge: 2024-03-03 | Disposition: A | Discharge status: Home / Self Care | Source: Ambulatory Visit | Attending: Obstetrics | Admitting: Obstetrics

## 2024-03-03 DIAGNOSIS — Z1231 Encounter for screening mammogram for malignant neoplasm of breast: Secondary | ICD-10-CM | POA: Insufficient documentation
